# Patient Record
Sex: Male | Born: 2000 | Race: Black or African American | Hispanic: No | Marital: Single | Smoking: Never smoker
Health system: Southern US, Community
[De-identification: ages and names within clinical notes are randomized; demographics above are authoritative.]

## PROBLEM LIST (undated history)

## (undated) DIAGNOSIS — J45909 Unspecified asthma, uncomplicated: Secondary | ICD-10-CM

---
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
## 2020-07-16 ENCOUNTER — Ambulatory Visit (INDEPENDENT_AMBULATORY_CARE_PROVIDER_SITE_OTHER): Payer: Medicaid Other

## 2020-07-16 ENCOUNTER — Other Ambulatory Visit: Payer: Self-pay

## 2020-07-16 ENCOUNTER — Encounter: Payer: Self-pay | Admitting: Emergency Medicine

## 2020-07-16 ENCOUNTER — Ambulatory Visit
Admission: EM | Admit: 2020-07-16 | Discharge: 2020-07-16 | Disposition: A | Discharge status: Home / Self Care | Payer: Medicaid Other | Attending: Family Medicine | Admitting: Family Medicine

## 2020-07-16 DIAGNOSIS — S93602A Unspecified sprain of left foot, initial encounter: Secondary | ICD-10-CM

## 2020-07-16 DIAGNOSIS — S8002XA Contusion of left knee, initial encounter: Secondary | ICD-10-CM

## 2020-07-16 NOTE — ED Provider Notes (Signed)
[facility]    CSN: [id number] Arrival date & time: 07/16/20  1515      History   Chief Complaint Chief Complaint  Patient presents with  . Knee Pain    left  . Ankle Pain    left    HPI Ryan Wolf is a 19 y.o. male.   19 yo male with a c/o left knee and foot pain since falling while playing basketball and injuring it. States he was hit by another player on the knee and then fell to the hardwood floor. Denies any numbness/tingling.    Knee Pain Ankle Pain   Past Medical History:  Diagnosis Date  . Asthma    exercise induced    There are no problems to display for this patient.   History reviewed. No pertinent surgical history.     Home Medications    Prior to Admission medications   Medication Sig Start Date End Date Taking? Authorizing Provider  albuterol (PROVENTIL HFA) 108 (90 Base) MCG/ACT inhaler Inhale 2 puffs into the lungs every 4 (four) hours as needed. 05/16/17  Yes [provider]  ondansetron (ZOFRAN) 4 MG tablet Take 1 tablet (4 mg total) by mouth every 6 (six) hours. 05/16/18   Duanne Limerick, MD    Family History Family History  Problem Relation Age of Onset  . Heart attack Mother   . CAD Mother   . Healthy Father     Social History Social History   Tobacco Use  . Smoking status: Never Smoker  . Smokeless tobacco: Never Used  Vaping Use  . Vaping Use: Never used  Substance Use Topics  . Alcohol use: Never  . Drug use: Never     Allergies   Penicillins   Review of Systems Review of Systems   Physical Exam Triage Vital Signs ED Triage Vitals  Enc Vitals Group     BP 07/16/20 1537 115/73     Pulse Rate 07/16/20 1537 60     Resp 07/16/20 1537 16     Temp 07/16/20 1537 98.4 F (36.9 C)     Temp Source 07/16/20 1537 Oral     SpO2 07/16/20 1537 98 %     Weight 07/16/20 1535 134 lb 3.2 oz (60.9 kg)     Height --      Head Circumference --      Peak Flow --      Pain Score 07/16/20 1534 7       Pain Loc --      Pain Edu? --      Excl. in GC? --    No data found.  Updated Vital Signs BP 115/73 (BP Location: Left Arm)   Pulse 60   Temp 98.4 F (36.9 C) (Oral)   Resp 16   Wt 60.9 kg   SpO2 98%   Visual Acuity Right Eye Distance:   Left Eye Distance:   Bilateral Distance:    Right Eye Near:   Left Eye Near:    Bilateral Near:     Physical Exam Vitals and nursing note reviewed.  Constitutional:      General: He is not in acute distress.    Appearance: He is not toxic-appearing or diaphoretic.  Musculoskeletal:     Left knee: Swelling (mild) and bony tenderness present. No deformity, effusion, erythema, ecchymosis or lacerations. Normal range of motion. Normal pulse.     Left foot: Normal range of motion and normal capillary refill. Bony tenderness present.  No swelling, deformity, bunion, Charcot foot, foot drop, prominent metatarsal heads, laceration or crepitus. Normal pulse.     Comments: Left lower extremity neurovascularly intact  Neurological:     Mental Status: He is alert.      UC Treatments / Results  Labs (all labs ordered are listed, but only abnormal results are displayed) Labs Reviewed - No data to display  EKG   Radiology DG Knee Complete 4 Views Left  Result Date: 07/16/2020 CLINICAL DATA:  Pain after fall EXAM: LEFT KNEE - COMPLETE 4+ VIEW COMPARISON:  None. FINDINGS: No evidence of fracture, dislocation, or joint effusion. No evidence of arthropathy or other focal bone abnormality. Soft tissues are unremarkable. IMPRESSION: Negative. Electronically Signed   By: Jasmine Pang M.D.   On: 07/16/2020 17:14   DG Foot Complete Left  Result Date: 07/16/2020 CLINICAL DATA:  Left foot pain after fall EXAM: LEFT FOOT - COMPLETE 3+ VIEW COMPARISON:  None. FINDINGS: There is no evidence of fracture or dislocation. There is no evidence of arthropathy or other focal bone abnormality. Soft tissues are unremarkable. IMPRESSION: Negative. Electronically  Signed   By: Jasmine Pang M.D.   On: 07/16/2020 17:14    Procedures Procedures (including critical care time)  Medications Ordered in UC Medications - No data to display  Initial Impression / Assessment and Plan / UC Course  I have reviewed the triage vital signs and the nursing notes.  Pertinent labs & imaging results that were available during my care of the patient were reviewed by me and considered in my medical decision making (see chart for details).     Final Clinical Impressions(s) / UC Diagnoses   Final diagnoses:  Contusion of left knee, initial encounter  Foot sprain, left, initial encounter     Discharge Instructions     Rest, ice, elevation, over the counter tylenol/ibuprofen    ED Prescriptions    None      1. x-ray results and diagnosis reviewed with patient 2. Recommend supportive treatment as above 3. Follow-up prn if symptoms worsen or don't improve   PDMP not reviewed this encounter.   Payton Mccallum, MD 07/16/20 1721

## 2020-07-16 NOTE — Discharge Instructions (Signed)
Rest, ice, elevation, over the counter tylenol/ibuprofen

## 2020-07-16 NOTE — ED Triage Notes (Signed)
Patient states that he was playing basketball this morning and another player ran into him and patient fell down.  Patient c/o pain in his left knee, left ankle and left foot.

## 2021-11-04 ENCOUNTER — Other Ambulatory Visit: Payer: Self-pay

## 2021-11-04 ENCOUNTER — Ambulatory Visit
Admission: EM | Admit: 2021-11-04 | Discharge: 2021-11-04 | Disposition: A | Discharge status: Home / Self Care | Payer: Medicaid Other | Attending: Physician Assistant | Admitting: Physician Assistant

## 2021-11-04 ENCOUNTER — Encounter: Payer: Self-pay | Admitting: Emergency Medicine

## 2021-11-04 DIAGNOSIS — J069 Acute upper respiratory infection, unspecified: Secondary | ICD-10-CM | POA: Diagnosis present

## 2021-11-04 DIAGNOSIS — R051 Acute cough: Secondary | ICD-10-CM | POA: Insufficient documentation

## 2021-11-04 DIAGNOSIS — R0981 Nasal congestion: Secondary | ICD-10-CM | POA: Diagnosis present

## 2021-11-04 LAB — RAPID INFLUENZA A&B ANTIGENS
Influenza A (ARMC): NEGATIVE
Influenza B (ARMC): NEGATIVE

## 2021-11-04 MED ORDER — PREDNISONE 10 MG (21) PO TBPK: ORAL_TABLET | Freq: Every day | ORAL | 0 refills | Status: DC

## 2021-11-04 NOTE — Discharge Instructions (Addendum)
Your symptoms are for viral in nature  Take oct meds to help with symptoms such as dayquil  Take tylenol or motrin as needed for fever or pain

## 2021-11-04 NOTE — ED Triage Notes (Signed)
Patient c/o cough, nasal congestion, and runny nose and bodyaches that started yesterday. Patient states that he was exposed to the flu.

## 2021-11-04 NOTE — ED Provider Notes (Signed)
[facility]    CSN: [id number] Arrival date & time: 11/04/21  0834      History   Chief Complaint Chief Complaint  Patient presents with   Cough   Nasal Congestion    HPI Ryan Wolf is a 20 y.o. male.   Pt is here for cough, congestion, body aches. States that he was exposed to someone with the flu. Denies any known fever. Has not taken anthing pta.    Past Medical History:  Diagnosis Date   Asthma    exercise induced    There are no problems to display for this patient.   No past surgical history on file.     Home Medications    Prior to Admission medications   Medication Sig Start Date End Date Taking? Authorizing Provider  predniSONE (STERAPRED UNI-PAK 21 TAB) 10 MG (21) TBPK tablet Take by mouth daily. Take 6 tabs by mouth daily  for 2 days, then 5 tabs for 2 days, then 4 tabs for 2 days, then 3 tabs for 2 days, 2 tabs for 2 days, then 1 tab by mouth daily for 2 days 11/04/21  Yes Coralyn Mark, NP  albuterol (PROVENTIL HFA) 108 (90 Base) MCG/ACT inhaler Inhale 2 puffs into the lungs every 4 (four) hours as needed. 05/16/17   [provider]  ondansetron (ZOFRAN) 4 MG tablet Take 1 tablet (4 mg total) by mouth every 6 (six) hours. 05/16/18   Duanne Limerick, MD    Family History Family History  Problem Relation Age of Onset   Heart attack Mother    CAD Mother    Healthy Father     Social History Social History   Tobacco Use   Smoking status: Never   Smokeless tobacco: Never  Vaping Use   Vaping Use: Every day  Substance Use Topics   Alcohol use: Never   Drug use: Never     Allergies   Penicillins   Review of Systems Review of Systems  Constitutional:  Negative for fever.  HENT:  Positive for congestion, postnasal drip, rhinorrhea, sinus pressure, sinus pain and sneezing. Negative for sore throat.   Respiratory:  Positive for cough. Negative for shortness of breath and wheezing.   Cardiovascular: Negative.    Gastrointestinal: Negative.   Genitourinary: Negative.   Musculoskeletal: Negative.   Neurological: Negative.     Physical Exam Triage Vital Signs ED Triage Vitals  Enc Vitals Group     BP 11/04/21 0905 93/76     Pulse Rate 11/04/21 0905 66     Resp 11/04/21 0905 16     Temp 11/04/21 0905 98.3 F (36.8 C)     Temp Source 11/04/21 0905 Oral     SpO2 11/04/21 0905 99 %     Weight 11/04/21 0902 135 lb (61.2 kg)     Height 11/04/21 0902 5\' 7"  (1.702 m)     Head Circumference --      Peak Flow --      Pain Score 11/04/21 0902 0     Pain Loc --      Pain Edu? --      Excl. in GC? --    No data found.  Updated Vital Signs BP 93/76 (BP Location: Left Arm)   Pulse 66   Temp 98.3 F (36.8 C) (Oral)   Resp 16   Ht 5\' 7"  (1.702 m)   Wt 135 lb (61.2 kg)   SpO2 99%   BMI 21.14 kg/m  Visual Acuity Right Eye Distance:   Left Eye Distance:   Bilateral Distance:    Right Eye Near:   Left Eye Near:    Bilateral Near:     Physical Exam Constitutional:      Appearance: Normal appearance.  HENT:     Right Ear: Tympanic membrane normal.     Left Ear: Tympanic membrane normal.     Nose: Congestion and rhinorrhea present.     Mouth/Throat:     Mouth: Mucous membranes are moist.     Pharynx: Oropharynx is clear. No oropharyngeal exudate or posterior oropharyngeal erythema.  Eyes:     Pupils: Pupils are equal, round, and reactive to light.  Cardiovascular:     Rate and Rhythm: Normal rate.  Pulmonary:     Effort: Pulmonary effort is normal.  Abdominal:     General: Abdomen is flat.  Skin:    General: Skin is warm.  Neurological:     General: No focal deficit present.     Mental Status: He is alert.     UC Treatments / Results  Labs (all labs ordered are listed, but only abnormal results are displayed) Labs Reviewed  RAPID INFLUENZA A&B ANTIGENS    EKG   Radiology No results found.  Procedures Procedures (including critical care time)  Medications  Ordered in UC Medications - No data to display  Initial Impression / Assessment and Plan / UC Course  I have reviewed the triage vital signs and the nursing notes.  Pertinent labs & imaging results that were available during my care of the patient were reviewed by me and considered in my medical decision making (see chart for details).     your symptoms are for viral in nature  Take oct meds to help with symptoms such as dayquil  Take tylenol or motrin as needed for fever or pain  Flu test negative  Final Clinical Impressions(s) / UC Diagnoses   Final diagnoses:  Acute cough  Nasal congestion  Viral URI with cough     Discharge Instructions      Your symptoms are for viral in nature  Take oct meds to help with symptoms such as dayquil  Take tylenol or motrin as needed for fever or pain       ED Prescriptions     Medication Sig Dispense Auth. Provider   predniSONE (STERAPRED UNI-PAK 21 TAB) 10 MG (21) TBPK tablet Take by mouth daily. Take 6 tabs by mouth daily  for 2 days, then 5 tabs for 2 days, then 4 tabs for 2 days, then 3 tabs for 2 days, 2 tabs for 2 days, then 1 tab by mouth daily for 2 days 42 tablet Coralyn Mark, NP      PDMP not reviewed this encounter.   Coralyn Mark, NP 11/04/21 [phone]

## 2021-11-30 ENCOUNTER — Ambulatory Visit
Admission: EM | Admit: 2021-11-30 | Discharge: 2021-11-30 | Disposition: A | Discharge status: Home / Self Care | Payer: Medicaid Other | Attending: Medical Oncology | Admitting: Medical Oncology

## 2021-11-30 ENCOUNTER — Other Ambulatory Visit: Payer: Self-pay

## 2021-11-30 ENCOUNTER — Encounter: Payer: Self-pay | Admitting: Emergency Medicine

## 2021-11-30 DIAGNOSIS — J029 Acute pharyngitis, unspecified: Secondary | ICD-10-CM | POA: Insufficient documentation

## 2021-11-30 LAB — GROUP A STREP BY PCR: Group A Strep by PCR: NOT DETECTED

## 2021-11-30 NOTE — ED Provider Notes (Signed)
[facility]    CSN: [id number] Arrival date & time: 11/30/21  1224      History   Chief Complaint Chief Complaint  Patient presents with   Sore Throat    HPI Ryan Wolf is a 20 y.o. male.   HPI  Sore Throat: Pt states that he has had a sore throat for the last few hours- symptoms started last night.  Symptoms are worse when he tries to eat or drink or talk.  He denies any other symptoms such as fever, vomiting, headache, cough.  He has tried cough drops for symptoms as he cannot think of anything else to try.  Cough drops do not really help his symptoms.  Past Medical History:  Diagnosis Date   Asthma    exercise induced    There are no problems to display for this patient.   History reviewed. No pertinent surgical history.     Home Medications    Prior to Admission medications   Medication Sig Start Date End Date Taking? Authorizing Provider  albuterol (PROVENTIL HFA) 108 (90 Base) MCG/ACT inhaler Inhale 2 puffs into the lungs every 4 (four) hours as needed. 05/16/17   [provider]  ondansetron (ZOFRAN) 4 MG tablet Take 1 tablet (4 mg total) by mouth every 6 (six) hours. 05/16/18   Duanne Limerick, MD  predniSONE (STERAPRED UNI-PAK 21 TAB) 10 MG (21) TBPK tablet Take by mouth daily. Take 6 tabs by mouth daily  for 2 days, then 5 tabs for 2 days, then 4 tabs for 2 days, then 3 tabs for 2 days, 2 tabs for 2 days, then 1 tab by mouth daily for 2 days 11/04/21   Coralyn Mark, NP    Family History Family History  Problem Relation Age of Onset   Heart attack Mother    CAD Mother    Healthy Father     Social History Social History   Tobacco Use   Smoking status: Never   Smokeless tobacco: Never  Vaping Use   Vaping Use: Every day  Substance Use Topics   Alcohol use: Never   Drug use: Never     Allergies   Penicillins   Review of Systems Review of Systems  As stated above in HPI Physical Exam Triage Vital  Signs ED Triage Vitals  Enc Vitals Group     BP 11/30/21 1434 (!) 142/80     Pulse Rate 11/30/21 1434 62     Resp 11/30/21 1434 16     Temp 11/30/21 1434 98.6 F (37 C)     Temp Source 11/30/21 1434 Oral     SpO2 11/30/21 1434 100 %     Weight --      Height --      Head Circumference --      Peak Flow --      Pain Score 11/30/21 1435 6     Pain Loc --      Pain Edu? --      Excl. in GC? --    No data found.  Updated Vital Signs BP (!) 142/80 (BP Location: Left Arm)   Pulse 62   Temp 98.6 F (37 C) (Oral)   Resp 16   SpO2 100%   Physical Exam Vitals and nursing note reviewed.  Constitutional:      General: He is not in acute distress.    Appearance: He is well-developed. He is not ill-appearing, toxic-appearing or diaphoretic.  HENT:  Head: Normocephalic and atraumatic.     Right Ear: Tympanic membrane normal.     Left Ear: Tympanic membrane normal.     Nose: No congestion or rhinorrhea.     Mouth/Throat:     Mouth: Mucous membranes are moist.     Pharynx: Oropharynx is clear. Uvula midline. Posterior oropharyngeal erythema present. No pharyngeal swelling, oropharyngeal exudate or uvula swelling.     Tonsils: No tonsillar exudate or tonsillar abscesses.  Eyes:     Conjunctiva/sclera: Conjunctivae normal.     Pupils: Pupils are equal, round, and reactive to light.  Cardiovascular:     Rate and Rhythm: Normal rate and regular rhythm.     Heart sounds: Normal heart sounds.  Pulmonary:     Effort: Pulmonary effort is normal.     Breath sounds: Normal breath sounds.  Musculoskeletal:     Cervical back: Neck supple.  Lymphadenopathy:     Cervical: Cervical adenopathy present.  Skin:    General: Skin is warm.  Neurological:     Mental Status: He is alert and oriented to person, place, and time.     UC Treatments / Results  Labs (all labs ordered are listed, but only abnormal results are displayed) Labs Reviewed  GROUP A STREP BY PCR     EKG   Radiology No results found.  Procedures Procedures (including critical care time)  Medications Ordered in UC Medications - No data to display  Initial Impression / Assessment and Plan / UC Course  I have reviewed the triage vital signs and the nursing notes.  Pertinent labs & imaging results that were available during my care of the patient were reviewed by me and considered in my medical decision making (see chart for details).     New. Strep PCR pending. Will treat appropriately. If not positive for strep this is likely viral in nature. Rest and hydration with water. Tylenol or Ibuprofen PRN. Follow up PRN.    Final Clinical Impressions(s) / UC Diagnoses   Final diagnoses:  Pharyngitis, unspecified etiology   Discharge Instructions   None    ED Prescriptions   None    PDMP not reviewed this encounter.   Rushie Chestnut, [place] 11/30/21 1600

## 2021-11-30 NOTE — ED Triage Notes (Signed)
Pt c/o ST that started last night  

## 2022-02-20 ENCOUNTER — Other Ambulatory Visit: Payer: Self-pay

## 2022-02-20 ENCOUNTER — Ambulatory Visit
Admission: EM | Admit: 2022-02-20 | Discharge: 2022-02-20 | Disposition: A | Discharge status: Home / Self Care | Payer: Medicaid Other | Attending: Physician Assistant | Admitting: Physician Assistant

## 2022-02-20 DIAGNOSIS — J45901 Unspecified asthma with (acute) exacerbation: Secondary | ICD-10-CM | POA: Insufficient documentation

## 2022-02-20 DIAGNOSIS — Z20822 Contact with and (suspected) exposure to covid-19: Secondary | ICD-10-CM | POA: Insufficient documentation

## 2022-02-20 DIAGNOSIS — R051 Acute cough: Secondary | ICD-10-CM | POA: Diagnosis not present

## 2022-02-20 DIAGNOSIS — B349 Viral infection, unspecified: Secondary | ICD-10-CM | POA: Diagnosis present

## 2022-02-20 DIAGNOSIS — J029 Acute pharyngitis, unspecified: Secondary | ICD-10-CM | POA: Insufficient documentation

## 2022-02-20 LAB — RESP PANEL BY RT-PCR (FLU A&B, COVID) ARPGX2
Influenza A by PCR: NEGATIVE
Influenza B by PCR: NEGATIVE
SARS Coronavirus 2 by RT PCR: NEGATIVE

## 2022-02-20 LAB — GROUP A STREP BY PCR: Group A Strep by PCR: NOT DETECTED

## 2022-02-20 MED ORDER — PREDNISONE 20 MG PO TABS: 40.0000 mg | ORAL_TABLET | Freq: Every day | ORAL | 0 refills | Status: AC

## 2022-02-20 MED ORDER — LIDOCAINE VISCOUS HCL 2 % MT SOLN: 15.0000 mL | OROMUCOSAL | 0 refills | Status: DC | PRN

## 2022-02-20 NOTE — Discharge Instructions (Signed)
-  Your strep test is negative. - We have obtained a COVID and flu test.  Someone will call you with the results when we get it today.  If you are positive for COVID you to be isolated 5 days from symptom onset and wear mask for 5 days. - Symptoms are consistent with a virus.  I also suspect her asthma has been flared up since you have a lot of wheezing.  I have sent prednisone to the pharmacy.  You should also use your inhaler at home.  Additionally I have sent a cough medication for you to try and viscous lidocaine to help with your sore throat. - You should be feeling better within the next week or so. - If you are breathing were to worsen you should be seen again.

## 2022-02-20 NOTE — ED Provider Notes (Signed)
[facility]    CSN: [id number] Arrival date & time: 02/20/22  1337      History   Chief Complaint Chief Complaint  Patient presents with   Sore Throat    HPI Nicohlas Stjulien is a 21 y.o. male presenting for 2-day history of sore throat, cough and chest pain on coughing.  Also reports wheezing and chest tightness.  Nasal congestion as well.  No fevers but he has been sweaty.  No sick contacts or known exposure to COVID-19.  Patient has taken over-the-counter cough medication without improvement in symptoms.  He has a history of asthma.  Has not used his inhaler at home.  No other concerns.  HPI  Past Medical History:  Diagnosis Date   Asthma    exercise induced    There are no problems to display for this patient.   History reviewed. No pertinent surgical history.     Home Medications    Prior to Admission medications   Medication Sig Start Date End Date Taking? Authorizing Provider  albuterol (PROVENTIL HFA) 108 (90 Base) MCG/ACT inhaler Inhale 2 puffs into the lungs every 4 (four) hours as needed. 05/16/17   [provider]  ondansetron (ZOFRAN) 4 MG tablet Take 1 tablet (4 mg total) by mouth every 6 (six) hours. 05/16/18   Duanne Limerick, MD  predniSONE (STERAPRED UNI-PAK 21 TAB) 10 MG (21) TBPK tablet Take by mouth daily. Take 6 tabs by mouth daily  for 2 days, then 5 tabs for 2 days, then 4 tabs for 2 days, then 3 tabs for 2 days, 2 tabs for 2 days, then 1 tab by mouth daily for 2 days 11/04/21   Coralyn Mark, NP    Family History Family History  Problem Relation Age of Onset   Heart attack Mother    CAD Mother    Healthy Father     Social History Social History   Tobacco Use   Smoking status: Never   Smokeless tobacco: Never  Vaping Use   Vaping Use: Every day  Substance Use Topics   Alcohol use: Yes   Drug use: Never     Allergies   Penicillins   Review of Systems Review of Systems  Constitutional:  Positive for  diaphoresis. Negative for fatigue and fever.  HENT:  Positive for congestion and sore throat. Negative for rhinorrhea, sinus pressure and sinus pain.   Respiratory:  Positive for cough, chest tightness and wheezing. Negative for shortness of breath.   Cardiovascular:  Positive for chest pain.  Gastrointestinal:  Negative for abdominal pain, diarrhea, nausea and vomiting.  Musculoskeletal:  Negative for myalgias.  Neurological:  Negative for weakness, light-headedness and headaches.  Hematological:  Negative for adenopathy.    Physical Exam Triage Vital Signs ED Triage Vitals  Enc Vitals Group     BP 02/20/22 1509 (!) 132/93     Pulse Rate 02/20/22 1509 75     Resp 02/20/22 1509 18     Temp 02/20/22 1509 98.5 F (36.9 C)     Temp Source 02/20/22 1509 Oral     SpO2 02/20/22 1509 99 %     Weight 02/20/22 1508 130 lb (59 kg)     Height 02/20/22 1508 5\' 7"  (1.702 m)     Head Circumference --      Peak Flow --      Pain Score 02/20/22 1507 7     Pain Loc --      Pain Edu? --  Excl. in GC? --    No data found.  Updated Vital Signs BP (!) 132/93 (BP Location: Left Arm)    Pulse 75    Temp 98.5 F (36.9 C) (Oral)    Resp 18    Ht 5\' 7"  (1.702 m)    Wt 130 lb (59 kg)    SpO2 99%    BMI 20.36 kg/m      Physical Exam Vitals and nursing note reviewed.  Constitutional:      General: He is not in acute distress.    Appearance: Normal appearance. He is well-developed. He is ill-appearing.  HENT:     Head: Normocephalic and atraumatic.     Nose: Nose normal.     Mouth/Throat:     Mouth: Mucous membranes are moist.     Pharynx: Oropharynx is clear. Posterior oropharyngeal erythema present.  Eyes:     Conjunctiva/sclera: Conjunctivae normal.  Cardiovascular:     Rate and Rhythm: Normal rate and regular rhythm.     Heart sounds: Normal heart sounds. No murmur heard. Pulmonary:     Effort: Pulmonary effort is normal. No respiratory distress.     Breath sounds: Wheezing (diffuse  wheezing throughout all lung fields) present.  Musculoskeletal:     Cervical back: Neck supple.  Skin:    General: Skin is warm and dry.     Capillary Refill: Capillary refill takes less than 2 seconds.  Neurological:     General: No focal deficit present.     Mental Status: He is alert. Mental status is at baseline.     Motor: No weakness.     Coordination: Coordination normal.     Gait: Gait normal.  Psychiatric:        Mood and Affect: Mood normal.        Behavior: Behavior normal.        Thought Content: Thought content normal.     UC Treatments / Results  Labs (all labs ordered are listed, but only abnormal results are displayed) Labs Reviewed  GROUP A STREP BY PCR  RESP PANEL BY RT-PCR (FLU A&B, COVID) [id number]    EKG   Radiology No results found.  Procedures Procedures (including critical care time)  Medications Ordered in UC Medications - No data to display  Initial Impression / Assessment and Plan / UC Course  I have reviewed the triage vital signs and the nursing notes.  Pertinent labs & imaging results that were available during my care of the patient were reviewed by me and considered in my medical decision making (see chart for details).  21 year old male presenting with 2-day history of cough, sore throat and chest tightness/pain.  Vitals are stable.  Patient has mildly ill-appearing but nontoxic.  On exam does have erythema posterior pharynx.  Mild nasal congestion.  Diffuse wheezing throughout chest.  Strep test negative.  Respiratory panel obtained.  Advised we will contact him with results.  Negative respiratory panel.  Patient advised symptoms consistent with a viral illness and likely asthma exacerbation.  Supportive care reviewed.  Advised to increase rest and fluids.  Sent prednisone and viscous lidocaine.  Advised to use at home inhaler.  Reviewed return and ER precautions.   Final Clinical Impressions(s) / UC Diagnoses   Final diagnoses:   Viral illness  Asthma with acute exacerbation, unspecified asthma severity, unspecified whether persistent  Sore throat  Acute cough     Discharge Instructions      -Your strep test is negative. -  We have obtained a COVID and flu test.  Someone will call you with the results when we get it today.  If you are positive for COVID you to be isolated 5 days from symptom onset and wear mask for 5 days. - Symptoms are consistent with a virus.  I also suspect her asthma has been flared up since you have a lot of wheezing.  I have sent prednisone to the pharmacy.  You should also use your inhaler at home.  Additionally I have sent a cough medication for you to try and viscous lidocaine to help with your sore throat. - You should be feeling better within the next week or so. - If you are breathing were to worsen you should be seen again.     ED Prescriptions   None    PDMP not reviewed this encounter.   Shirlee Latch, PA-C 02/20/22 1658

## 2022-02-20 NOTE — ED Triage Notes (Signed)
Pt c/o throat and chest "problems" x4 days. Pt states that he has had a cough and night sweats.   Pt wants to be tested for strep throat.   Pt states that if he breathes too deeply his chest feels like its "closing" and he has chest pain.   Pt states that his throat hurts when swallowing.

## 2022-04-06 ENCOUNTER — Ambulatory Visit
Admission: RE | Admit: 2022-04-06 | Discharge: 2022-04-06 | Disposition: A | Discharge status: Home / Self Care | Payer: Medicaid Other | Source: Ambulatory Visit | Attending: Emergency Medicine | Admitting: Emergency Medicine

## 2022-04-06 ENCOUNTER — Ambulatory Visit (INDEPENDENT_AMBULATORY_CARE_PROVIDER_SITE_OTHER): Payer: Medicaid Other

## 2022-04-06 ENCOUNTER — Other Ambulatory Visit: Payer: Self-pay

## 2022-04-06 VITALS — BP 143/79 | HR 86 | Temp 99.4°F | Resp 18

## 2022-04-06 DIAGNOSIS — J029 Acute pharyngitis, unspecified: Secondary | ICD-10-CM

## 2022-04-06 DIAGNOSIS — M542 Cervicalgia: Secondary | ICD-10-CM

## 2022-04-06 DIAGNOSIS — J020 Streptococcal pharyngitis: Secondary | ICD-10-CM | POA: Diagnosis not present

## 2022-04-06 LAB — CBC WITH DIFFERENTIAL/PLATELET
Abs Immature Granulocytes: 0.02 10*3/uL (ref 0.00–0.07)
Basophils Absolute: 0.1 10*3/uL (ref 0.0–0.1)
Basophils Relative: 1 %
Eosinophils Absolute: 0.4 10*3/uL (ref 0.0–0.5)
Eosinophils Relative: 4 %
HCT: 40.9 % (ref 39.0–52.0)
Hemoglobin: 14 g/dL (ref 13.0–17.0)
Immature Granulocytes: 0 %
Lymphocytes Relative: 20 %
Lymphs Abs: 2 10*3/uL (ref 0.7–4.0)
MCH: 31.3 pg (ref 26.0–34.0)
MCHC: 34.2 g/dL (ref 30.0–36.0)
MCV: 91.5 fL (ref 80.0–100.0)
Monocytes Absolute: 0.6 10*3/uL (ref 0.1–1.0)
Monocytes Relative: 6 %
Neutro Abs: 6.9 10*3/uL (ref 1.7–7.7)
Neutrophils Relative %: 69 %
Platelets: 453 10*3/uL — ABNORMAL HIGH (ref 150–400)
RBC: 4.47 MIL/uL (ref 4.22–5.81)
RDW: 12.3 % (ref 11.5–15.5)
WBC: 9.9 10*3/uL (ref 4.0–10.5)
nRBC: 0 % (ref 0.0–0.2)

## 2022-04-06 LAB — MONONUCLEOSIS SCREEN: Mono Screen: NEGATIVE

## 2022-04-06 LAB — GROUP A STREP BY PCR: Group A Strep by PCR: DETECTED — AB

## 2022-04-06 MED ORDER — IBUPROFEN 600 MG PO TABS: 600.0000 mg | ORAL_TABLET | Freq: Four times a day (QID) | ORAL | 0 refills | Status: DC | PRN

## 2022-04-06 MED ORDER — AZITHROMYCIN 500 MG PO TABS: 500.0000 mg | ORAL_TABLET | Freq: Every day | ORAL | 0 refills | Status: AC

## 2022-04-06 MED ORDER — ACETAMINOPHEN 500 MG PO TABS
1000.0000 mg | ORAL_TABLET | Freq: Once | ORAL | Status: AC
  Administered 2022-04-06: 1000 mg via ORAL

## 2022-04-06 MED ORDER — IBUPROFEN 800 MG PO TABS
800.0000 mg | ORAL_TABLET | Freq: Once | ORAL | Status: AC
  Administered 2022-04-06: 800 mg via ORAL

## 2022-04-06 NOTE — ED Triage Notes (Addendum)
Pt is here with a swollen neck pain/sore throat that started 2 days,pt has not taken any meds to relieve discomfort. ?

## 2022-04-06 NOTE — Discharge Instructions (Addendum)
Your strep PCR test was positive today.  Your mono is negative and your CBC is normal.  1 gram of Tylenol and 600 mg ibuprofen together 3-4 times a day as needed for pain.  Make sure you drink plenty of extra fluids.  Some people find salt water gargles and  Traditional Medic[employer]'s "Throat Coat" tea helpful. Take 5 mL of liquid Benadryl and 5 mL of Maalox. Mix it together, and then hold it in your mouth for as long as you can and then swallow. You may do this 4 times a day.   ? ?Go to www.goodrx.com  or www.costplusdrugs.com to look up your medications. This will give you a list of where you can find your prescriptions at the most affordable prices. Or ask the pharmacist what the cash price is, or if they have any other discount programs available to help make your medication more affordable. This can be less expensive than what you would pay with insurance.   ?

## 2022-04-06 NOTE — ED Provider Notes (Signed)
?HPI ? ?SUBJECTIVE: ? ?Patient reports sore throat starting 2 days ago.  He reports bilateral neck swelling worse on the left.  He reports left ear pain coming from his neck, intermittent headaches, some rhinorrhea, occasional cough.  No fevers, body aches, and postnasal drip, shortness of breath, nausea, vomiting, diarrhea, abdominal pain, rash.  No voice changes, drooling, trismus.  He states that he felt as if his throat was swelling yesterday, but denies difficulty breathing or swallowing while it felt swollen.  He reports pain with neck extension and with lateral rotation to the right more than the left.  No allergy or GERD symptoms.  No COVID, flu, strep, mono exposure.  He got 3 doses of the COVID-vaccine.  He is not sure about the flu vaccine.  No acute dental pain, facial swelling, swelling underneath the jaw.  No trauma to the neck.  No antibiotics in the past month.  No antipyretic in the past 6 hours.  He tried sleeping without improvement in his symptoms.  Symptoms are worse with neck extension and rotation.  He has no past medical history.  PCP: [facility] in [place]. ? ?Past Medical History:  ?Diagnosis Date  ? Asthma   ? exercise induced  ? ? ?History reviewed. No pertinent surgical history. ? ?Family History  ?Problem Relation Age of Onset  ? Heart attack Mother   ? CAD Mother   ? Healthy Father   ? ? ?Social History  ? ?Tobacco Use  ? Smoking status: Never  ? Smokeless tobacco: Never  ?Vaping Use  ? Vaping Use: Every day  ?Substance Use Topics  ? Alcohol use: Yes  ? Drug use: Yes  ?  Types: Marijuana  ? ? ?No current facility-administered medications for this encounter. ? ?Current Outpatient Medications:  ?  albuterol (PROVENTIL HFA) 108 (90 Base) MCG/ACT inhaler, Inhale 2 puffs into the lungs every 4 (four) hours as needed., Disp: , Rfl:  ?  lidocaine (XYLOCAINE) 2 % solution, Use as directed 15 mLs in the mouth or throat every 3 (three) hours as needed for mouth pain (swish and spit).,  Disp: 100 mL, Rfl: 0 ?  ondansetron (ZOFRAN) 4 MG tablet, Take 1 tablet (4 mg total) by mouth every 6 (six) hours., Disp: 12 tablet, Rfl: 0 ? ?Allergies  ?Allergen Reactions  ? Penicillins Swelling  ?  Other reaction(s): RASH ?  ? ? ? ?ROS ? ?As noted in HPI.  ? ?Physical Exam ? ?BP (!) 143/79 (BP Location: Right Arm)   Pulse 86   Temp 99.4 ?F (37.4 ?C)   Resp 18   SpO2 100%  ? ?Constitutional: Well developed, well nourished, no acute distress.  No drooling, trismus, stridor ?Eyes:  EOMI, conjunctiva normal bilaterally ?HENT: Normocephalic, atraumatic,mucus membranes moist.  TMs normal bilaterally.  Mild nasal congestion.  Erythematous oropharynx, tonsils normal size without exudate.  Uvula midline.  Positive cobblestoning.  No obvious postnasal drip.  Normal dentition, nontender.  No swelling underneath the jaw or facial swelling.  Thyroid nontender, normal size.  No neck stiffness. ?Respiratory: Normal inspiratory effort ?Cardiovascular: Normal rate, no murmurs, rubs, gallops ?GI: nondistended, nontender. No appreciable splenomegaly ?skin: No rash, skin intact ?Lymph: Positive anterior and posterior cervical lymphadenopathy bilaterally.  Positive tender mass with visible neck swelling on the left.   ?Musculoskeletal: Patient able to flex/extend his neck.  Pain with extension and with lateral rotation to the left and right ?Neurologic: Alert & oriented x 3, no focal neuro deficits ?Psychiatric: Speech and  behavior appropriate. ? ?ED Course ? ? ?Medications  ?acetaminophen (TYLENOL) tablet 1,000 mg (1,000 mg Oral Given 04/06/22 1743)  ?ibuprofen (ADVIL) tablet 800 mg (800 mg Oral Given 04/06/22 1743)  ? ? ?Orders Placed This Encounter  ?Procedures  ? Group A Strep by PCR  ?  Standing Status:   Standing  ?  Number of Occurrences:   1  ?  Order Specific Question:   Patient immune status  ?  Answer:   Normal  ?  Order Specific Question:   Release to patient  ?  Answer:   Immediate  ? DG Neck Soft Tissue  ?  Standing  Status:   Standing  ?  Number of Occurrences:   1  ?  Order Specific Question:   Reason for Exam (SYMPTOM  OR DIAGNOSIS REQUIRED)  ?  Answer:   Sore throat, left-sided neck swelling  ? Mononucleosis screen  ?  Standing Status:   Standing  ?  Number of Occurrences:   1  ? CBC with Differential  ?  Standing Status:   Standing  ?  Number of Occurrences:   1  ? ? ?Results for orders placed or performed during the hospital encounter of 04/06/22 (from the past 24 hour(s))  ?Group A Strep by PCR     Status: Abnormal  ? Collection Time: 04/06/22  5:14 PM  ? Specimen: Throat; Sterile Swab  ?Result Value Ref Range  ? Group A Strep by PCR DETECTED (A) NOT DETECTED  ?CBC with Differential     Status: Abnormal  ? Collection Time: 04/06/22  5:52 PM  ?Result Value Ref Range  ? WBC 9.9 4.0 - 10.5 K/uL  ? RBC 4.47 4.22 - 5.81 MIL/uL  ? Hemoglobin 14.0 13.0 - 17.0 g/dL  ? HCT 40.9 39.0 - 52.0 %  ? MCV 91.5 80.0 - 100.0 fL  ? MCH 31.3 26.0 - 34.0 pg  ? MCHC 34.2 30.0 - 36.0 g/dL  ? RDW 12.3 11.5 - 15.5 %  ? Platelets 453 (H) 150 - 400 K/uL  ? nRBC 0.0 0.0 - 0.2 %  ? Neutrophils Relative % 69 %  ? Neutro Abs 6.9 1.7 - 7.7 K/uL  ? Lymphocytes Relative 20 %  ? Lymphs Abs 2.0 0.7 - 4.0 K/uL  ? Monocytes Relative 6 %  ? Monocytes Absolute 0.6 0.1 - 1.0 K/uL  ? Eosinophils Relative 4 %  ? Eosinophils Absolute 0.4 0.0 - 0.5 K/uL  ? Basophils Relative 1 %  ? Basophils Absolute 0.1 0.0 - 0.1 K/uL  ? Immature Granulocytes 0 %  ? Abs Immature Granulocytes 0.02 0.00 - 0.07 K/uL  ? ?DG Neck Soft Tissue ? ?Result Date: 04/06/2022 ?CLINICAL DATA:  Swollen neck sore throat EXAM: NECK SOFT TISSUES - 1+ VIEW COMPARISON:  None. FINDINGS: Prevertebral soft tissue thickness within normal limits. No retropharyngeal gas collections. Epiglottis within normal limits. Subglottic trachea is patent. No radiopaque foreign body IMPRESSION: Negative. Electronically Signed   By: Donavan Foil M.D.   On: 04/06/2022 17:49   ? ?ED Clinical Impression ? ?1. Strep  pharyngitis   ? ? ? ?ED Assessment/Plan ? ?Patient with a sore throat and visible neck swelling.  He has no facial swelling or swelling underneath the jaw that would be consistent with Ludewig's angina, blocked salivary gland.  Considered septic jugular venous thrombosis,, but think that this is less likely at this time.  checking strep, mono because of anterior and posterior cervical lymphadenopathy.  While he has  no drooling, trismus, muffled, hoarse voice, will get a lateral soft tissue neck because of the visible left-sided neck swelling.  Giving Tylenol and ibuprofen here. ? ?Imaging independently reviewed.  No evidence of epiglottitis, RPA.  See radiology report for details ? ?strep positive. Sending home with azithromycin for 5 days.  He reports "swelling up like a balloon" with penicillin.  He has never tried cephalosporins.  Home with ibuprofen, Tylenol, Benadryl/Maalox mixture. Patient to followup with PMD when necessary, work note for tomorrow. ? ?CBC normal.  Mono negative ? ? ?Discussed labs,  MDM, plan and followup with patient. Discussed sn/sx that should prompt return to the ED. patient agrees with plan.  ? ?Meds ordered this encounter  ?Medications  ? acetaminophen (TYLENOL) tablet 1,000 mg  ? ibuprofen (ADVIL) tablet 800 mg  ? ? ? ?*This clinic note was created using [employer]. Therefore, there may be occasional mistakes despite careful proofreading. ?  ?  ?Melynda Ripple, MD ?04/06/22 1823 ? ?

## 2022-11-27 ENCOUNTER — Ambulatory Visit
Admission: EM | Admit: 2022-11-27 | Discharge: 2022-11-27 | Disposition: A | Discharge status: Home / Self Care | Payer: Medicaid Other | Attending: Internal Medicine | Admitting: Internal Medicine

## 2022-11-27 DIAGNOSIS — J029 Acute pharyngitis, unspecified: Secondary | ICD-10-CM | POA: Diagnosis present

## 2022-11-27 LAB — GROUP A STREP BY PCR: Group A Strep by PCR: NOT DETECTED

## 2022-11-27 MED ORDER — FLUTICASONE PROPIONATE 50 MCG/ACT NA SUSP: 2.0000 | Freq: Every day | NASAL | 0 refills | Status: AC

## 2022-11-27 NOTE — ED Provider Notes (Signed)
[facility]    CSN: [id number] Arrival date & time: 11/27/22  1922      History   Chief Complaint No chief complaint on file.   HPI Ryan Wolf is a 21 y.o. male for evaluation of less than 24-hour throat pain.  The patient states that beginning last evening he began to experience a sore throat and feels as if he had some swelling in his throat but he denies any issues with swallowing food or drink, he denies any shortness of breath.  The patient was treated for strep throat earlier this year and also had to undergo a incision and drainage involving a abscess of a lymph node.  The patient denies any fevers or chills.  He denies any vision changes or headache.  He denies any hearing changes.  Denies any runny nose at today's appointment.  He denies any cough or chest pain or shortness of breath.  No nausea or vomiting.  No diarrhea.  He has not taken any medication for his throat at this time.  He is not taking any routine allergy medication at this time.  HPI  Past Medical History:  Diagnosis Date   Asthma    exercise induced    There are no problems to display for this patient.   History reviewed. No pertinent surgical history.     Home Medications    Prior to Admission medications   Medication Sig Start Date End Date Taking? Authorizing Provider  fluticasone (FLONASE) 50 MCG/ACT nasal spray Place 2 sprays into both nostrils daily. 11/27/22  Yes Anson Oregon, PA-C  albuterol (PROVENTIL HFA) 108 (90 Base) MCG/ACT inhaler Inhale 2 puffs into the lungs every 4 (four) hours as needed. 05/16/17   [provider]  ibuprofen (ADVIL) 600 MG tablet Take 1 tablet (600 mg total) by mouth every 6 (six) hours as needed. 04/06/22   Domenick Gong, MD  lidocaine (XYLOCAINE) 2 % solution Use as directed 15 mLs in the mouth or throat every 3 (three) hours as needed for mouth pain (swish and spit). 02/20/22   Eusebio Friendly B, PA-C  ondansetron (ZOFRAN) 4 MG  tablet Take 1 tablet (4 mg total) by mouth every 6 (six) hours. 05/16/18   Duanne Limerick, MD    Family History Family History  Problem Relation Age of Onset   Heart attack Mother    CAD Mother    Healthy Father     Social History Social History   Tobacco Use   Smoking status: Never   Smokeless tobacco: Never  Vaping Use   Vaping Use: Every day  Substance Use Topics   Alcohol use: Yes   Drug use: Yes    Types: Marijuana     Allergies   Penicillins   Review of Systems Review of Systems  HENT:  Positive for sore throat.   All other systems reviewed and are negative.  Physical Exam Triage Vital Signs ED Triage Vitals  Enc Vitals Group     BP 11/27/22 2004 127/81     Pulse Rate 11/27/22 2004 60     Resp 11/27/22 2004 20     Temp 11/27/22 2004 98.2 F (36.8 C)     Temp Source 11/27/22 2004 Oral     SpO2 11/27/22 2004 99 %     Weight --      Height --      Head Circumference --      Peak Flow --      Pain  Score 11/27/22 2014 0     Pain Loc --      Pain Edu? --      Excl. in GC? --    No data found.  Updated Vital Signs BP 127/81 (BP Location: Right Arm)   Pulse 60   Temp 98.2 F (36.8 C) (Oral)   Resp 20   SpO2 99%   Visual Acuity Right Eye Distance:   Left Eye Distance:   Bilateral Distance:    Right Eye Near:   Left Eye Near:    Bilateral Near:     Physical Exam Constitutional:      General: He is not in acute distress.    Appearance: Normal appearance. He is not diaphoretic.  HENT:     Right Ear: Tympanic membrane and ear canal normal.     Left Ear: Tympanic membrane and ear canal normal.     Nose: Rhinorrhea present.     Comments: Moderate erythema to bilateral nasal canals.    Mouth/Throat:     Pharynx: Posterior oropharyngeal erythema present. No oropharyngeal exudate.     Comments: Mild increase in swelling of tonsils identified. Eyes:     Conjunctiva/sclera: Conjunctivae normal.  Cardiovascular:     Rate and Rhythm: Normal  rate and regular rhythm.     Heart sounds: No murmur heard.    No friction rub. No gallop.  Pulmonary:     Effort: Pulmonary effort is normal.     Breath sounds: Normal breath sounds. No wheezing, rhonchi or rales.  Abdominal:     General: Abdomen is flat. Bowel sounds are normal.     Palpations: Abdomen is soft.  Musculoskeletal:     Cervical back: Normal range of motion. No tenderness.  Lymphadenopathy:     Cervical: No cervical adenopathy.  Neurological:     Mental Status: He is alert.      UC Treatments / Results  Labs (all labs ordered are listed, but only abnormal results are displayed) Labs Reviewed  GROUP A STREP BY PCR    EKG   Radiology No results found.  Procedures Procedures (including critical care time)  Medications Ordered in UC Medications - No data to display  Initial Impression / Assessment and Plan / UC Course  I have reviewed the triage vital signs and the nursing notes.  Pertinent labs & imaging results that were available during my care of the patient were reviewed by me and considered in my medical decision making (see chart for details).     1.  Treatment options were discussed today with the patient. 2.  I believe that his symptoms are likely allergic at this time.  He was instructed to begin taking Zyrtec routinely, Flonase has been prescribed for the patient as well. 3.  Strep test is pending, if positive will begin patient on antibiotics. 4.  Follow-up with urgent care if symptoms fail to improve. Final Clinical Impressions(s) / UC Diagnoses   Final diagnoses:  Acute pharyngitis, unspecified etiology     Discharge Instructions      -I believe your symptoms are likely due to allergies or viral at this time.  Would recommend continuing Zyrtec daily at this time. -I have sent in a prescription of a nasal spray to begin using routinely at this time. -Follow-up if symptoms fail to improve.  Contact urgent care tomorrow for results of  your recent strep test.    ED Prescriptions     Medication Sig Dispense Auth. Provider   fluticasone (  FLONASE) 50 MCG/ACT nasal spray Place 2 sprays into both nostrils daily. 16 g Anson Oregon, PA-C      PDMP not reviewed this encounter.   Anson Oregon, [place] 11/27/22 2104

## 2022-11-27 NOTE — Discharge Instructions (Addendum)
-  I believe your symptoms are likely due to allergies or viral at this time.  Would recommend continuing Zyrtec daily at this time. -I have sent in a prescription of a nasal spray to begin using routinely at this time. -Follow-up if symptoms fail to improve.  Contact urgent care tomorrow for results of your recent strep test.

## 2022-11-27 NOTE — ED Triage Notes (Incomplete)
Pt reports his throat is swollen and hard to swallow since yesterday.

## 2023-02-22 ENCOUNTER — Encounter: Payer: Self-pay | Admitting: Emergency Medicine

## 2023-02-22 ENCOUNTER — Ambulatory Visit
Admission: EM | Admit: 2023-02-22 | Discharge: 2023-02-22 | Disposition: A | Discharge status: Home / Self Care | Payer: Medicaid Other | Attending: Emergency Medicine | Admitting: Emergency Medicine

## 2023-02-22 DIAGNOSIS — J40 Bronchitis, not specified as acute or chronic: Secondary | ICD-10-CM | POA: Diagnosis not present

## 2023-02-22 DIAGNOSIS — J069 Acute upper respiratory infection, unspecified: Secondary | ICD-10-CM | POA: Diagnosis not present

## 2023-02-22 MED ORDER — PROMETHAZINE-DM 6.25-15 MG/5ML PO SYRP: 5.0000 mL | ORAL_SOLUTION | Freq: Four times a day (QID) | ORAL | 0 refills | Status: AC | PRN

## 2023-02-22 MED ORDER — DOXYCYCLINE HYCLATE 100 MG PO CAPS: 100.0000 mg | ORAL_CAPSULE | Freq: Two times a day (BID) | ORAL | 0 refills | Status: AC

## 2023-02-22 MED ORDER — PREDNISONE 20 MG PO TABS: 60.0000 mg | ORAL_TABLET | Freq: Every day | ORAL | 0 refills | Status: AC

## 2023-02-22 MED ORDER — IPRATROPIUM BROMIDE 0.06 % NA SOLN: 2.0000 | Freq: Four times a day (QID) | NASAL | 12 refills | Status: AC

## 2023-02-22 MED ORDER — BENZONATATE 100 MG PO CAPS: 200.0000 mg | ORAL_CAPSULE | Freq: Three times a day (TID) | ORAL | 0 refills | Status: AC

## 2023-02-22 NOTE — Discharge Instructions (Signed)
Use the albuterol inhaler/nebulizer every 4-6 hours as needed for shortness of breath, wheezing, and cough.  Use the Atrovent nasal spray, 2 squirts in each nostril every 6 hours as needed for congestion.  Take the Doxycycline twice daily with food for 7 days for treatment of bronchitis.  Take the prednisone according to the package instructions to help with pulmonary inflammation.  Use the Tessalon Perles every 8 hours for your cough.  Taken with a small sip of water.  They may give you some numbness to the base of your tongue or metallic taste in her mouth, this is normal.  They are designed to calm down the cough reflex.  Use the Promethazine DM cough syrup at bedtime as will make you drowsy.  You may take 1 teaspoon (5 mL) every 6 hours.  Return for reevaluation for new or worsening symptoms.

## 2023-02-22 NOTE — ED Provider Notes (Signed)
[facility]    CSN: [id number] Arrival date & time: 02/22/23  0804      History   Chief Complaint Chief Complaint  Patient presents with   Sore Throat   Cough    HPI Ryan Wolf is a 22 y.o. male.   HPI  22 year old male here for evaluation of sore throat and cough.  The patient has a past medical history significant for asthma and he is a smoker.  He is presenting for 1 week history of runny nose, nasal congestion, sore throat, cough, and intermittent headache.  He denies fever, ear pain, shortness of breath or wheezing, GI complaints, or bodyaches.  He states he was exposed to someone with similar symptoms and though their strep test was negative they are being treated for tonsillitis.  Past Medical History:  Diagnosis Date   Asthma    exercise induced    There are no problems to display for this patient.   History reviewed. No pertinent surgical history.     Home Medications    Prior to Admission medications   Medication Sig Start Date End Date Taking? Authorizing Provider  benzonatate (TESSALON) 100 MG capsule Take 2 capsules (200 mg total) by mouth every 8 (eight) hours. 02/22/23  Yes Margarette Canada, NP  doxycycline (VIBRAMYCIN) 100 MG capsule Take 1 capsule (100 mg total) by mouth 2 (two) times daily for 7 days. 02/22/23 03/01/23 Yes Margarette Canada, NP  ipratropium (ATROVENT) 0.06 % nasal spray Place 2 sprays into both nostrils 4 (four) times daily. 02/22/23  Yes Margarette Canada, NP  predniSONE (DELTASONE) 20 MG tablet Take 3 tablets (60 mg total) by mouth daily with breakfast for 5 days. 3 tablets daily for 5 days. 02/22/23 02/27/23 Yes Margarette Canada, NP  promethazine-dextromethorphan (PROMETHAZINE-DM) 6.25-15 MG/5ML syrup Take 5 mLs by mouth 4 (four) times daily as needed. 02/22/23  Yes Margarette Canada, NP  albuterol (PROVENTIL HFA) 108 (90 Base) MCG/ACT inhaler Inhale 2 puffs into the lungs every 4 (four) hours as needed. 05/16/17   [provider]   fluticasone (FLONASE) 50 MCG/ACT nasal spray Place 2 sprays into both nostrils daily. 11/27/22   Lattie Corns, PA-C    Family History Family History  Problem Relation Age of Onset   Heart attack Mother    CAD Mother    Healthy Father     Social History Social History   Tobacco Use   Smoking status: Never   Smokeless tobacco: Never  Vaping Use   Vaping Use: Every day  Substance Use Topics   Alcohol use: Yes   Drug use: Yes    Types: Marijuana     Allergies   Penicillins   Review of Systems Review of Systems  Constitutional:  Negative for fever.  HENT:  Positive for congestion, rhinorrhea and sore throat. Negative for ear pain.   Respiratory:  Positive for cough. Negative for shortness of breath and wheezing.   Gastrointestinal:  Negative for diarrhea, nausea and vomiting.  Skin:  Negative for rash.     Physical Exam Triage Vital Signs ED Triage Vitals [02/22/23 0814]  Enc Vitals Group     BP      Pulse      Resp      Temp      Temp src      SpO2      Weight      Height      Head Circumference      Peak Flow  Pain Score 0     Pain Loc      Pain Edu?      Excl. in [place]?    No data found.  Updated Vital Signs BP 129/89 (BP Location: Right Arm)   Pulse 66   Temp 98.2 F (36.8 C) (Oral)   Resp 16   SpO2 98%   Visual Acuity Right Eye Distance:   Left Eye Distance:   Bilateral Distance:    Right Eye Near:   Left Eye Near:    Bilateral Near:     Physical Exam Vitals and nursing note reviewed.  Constitutional:      Appearance: Normal appearance. He is not ill-appearing.  HENT:     Head: Normocephalic and atraumatic.     Right Ear: Tympanic membrane, ear canal and external ear normal. There is no impacted cerumen.     Left Ear: Tympanic membrane, ear canal and external ear normal. There is no impacted cerumen.     Nose: Congestion and rhinorrhea present.     Comments: Nasal mucosa is erythematous and mildly edematous with scant  clear discharge in both nares.    Mouth/Throat:     Mouth: Mucous membranes are moist.     Pharynx: Oropharynx is clear. Posterior oropharyngeal erythema present. No oropharyngeal exudate.     Comments: Tonsillar pillars are unremarkable.  There is erythema, injection, and clear postnasal drip in the posterior oropharynx. Cardiovascular:     Rate and Rhythm: Normal rate and regular rhythm.     Pulses: Normal pulses.     Heart sounds: Normal heart sounds. No murmur heard.    No friction rub. No gallop.  Pulmonary:     Effort: Pulmonary effort is normal.     Breath sounds: Wheezing and rhonchi present. No rales.     Comments: Patient has wheezes and rhonchi diffusely. Musculoskeletal:     Cervical back: Normal range of motion and neck supple.  Lymphadenopathy:     Cervical: No cervical adenopathy.  Skin:    General: Skin is warm and dry.     Capillary Refill: Capillary refill takes less than 2 seconds.     Findings: No rash.  Neurological:     General: No focal deficit present.     Mental Status: He is alert and oriented to person, place, and time.      UC Treatments / Results  Labs (all labs ordered are listed, but only abnormal results are displayed) Labs Reviewed - No data to display  EKG   Radiology No results found.  Procedures Procedures (including critical care time)  Medications Ordered in UC Medications - No data to display  Initial Impression / Assessment and Plan / UC Course  I have reviewed the triage vital signs and the nursing notes.  Pertinent labs & imaging results that were available during my care of the patient were reviewed by me and considered in my medical decision making (see chart for details).   Patient is a pleasant, nontoxic-appearing 22 year old male with a past history of asthma presenting for evaluation of 1 week history of respiratory complaints as outlined in HPI above.  Patient has not had a fever and he is afebrile in clinic at 98.2.   The rest of vital signs are unremarkable.  He has a 98% sat on room air.  He is not in any acute distress and he can speak in full sentence without dyspnea or tachypnea.  He does have diffuse wheezes and rhonchi in all lung  fields on auscultation which is presenting a bronchitis picture.  He is using his albuterol inhaler and nebulizer at home but he is also smoking.  Because he is a smoker and has asthma I will treat him with a 7-day course of doxycycline for his bronchitis along with Atrovent nasal spray to help with the nasal congestion, prednisone to help with pulmonary inflammation, Tessalon Perles and Promethazine DM cough syrup to help with cough and congestion.  He can continue his albuterol nebulizer and inhaler every 4 hours as needed.  Return precautions reviewed.  Work note provided.   Final Clinical Impressions(s) / UC Diagnoses   Final diagnoses:  Bronchitis  Upper respiratory tract infection, unspecified type     Discharge Instructions      Use the albuterol inhaler/nebulizer every 4-6 hours as needed for shortness of breath, wheezing, and cough.  Use the Atrovent nasal spray, 2 squirts in each nostril every 6 hours as needed for congestion.  Take the Doxycycline twice daily with food for 7 days for treatment of bronchitis.  Take the prednisone according to the package instructions to help with pulmonary inflammation.  Use the Tessalon Perles every 8 hours for your cough.  Taken with a small sip of water.  They may give you some numbness to the base of your tongue or metallic taste in her mouth, this is normal.  They are designed to calm down the cough reflex.  Use the Promethazine DM cough syrup at bedtime as will make you drowsy.  You may take 1 teaspoon (5 mL) every 6 hours.  Return for reevaluation for new or worsening symptoms.      ED Prescriptions     Medication Sig Dispense Auth. Provider   doxycycline (VIBRAMYCIN) 100 MG capsule Take 1 capsule (100 mg total)  by mouth 2 (two) times daily for 7 days. 14 capsule Margarette Canada, NP   benzonatate (TESSALON) 100 MG capsule Take 2 capsules (200 mg total) by mouth every 8 (eight) hours. 21 capsule Margarette Canada, NP   ipratropium (ATROVENT) 0.06 % nasal spray Place 2 sprays into both nostrils 4 (four) times daily. 15 mL Margarette Canada, NP   promethazine-dextromethorphan (PROMETHAZINE-DM) 6.25-15 MG/5ML syrup Take 5 mLs by mouth 4 (four) times daily as needed. 118 mL Margarette Canada, NP   predniSONE (DELTASONE) 20 MG tablet Take 3 tablets (60 mg total) by mouth daily with breakfast for 5 days. 3 tablets daily for 5 days. 15 tablet Margarette Canada, NP      PDMP not reviewed this encounter.   Margarette Canada, NP 02/22/23 [phone]

## 2023-02-22 NOTE — ED Triage Notes (Signed)
Pt presents with a cough and sore throat for over 1 week.

## 2023-04-28 IMAGING — CR DG NECK SOFT TISSUE
3 series · 3 of 3 positions shown · non-contrast
Comparison: None.

CLINICAL DATA: Swollen neck sore throat

EXAM:
NECK SOFT TISSUES - 1+ VIEW

[neck lat]
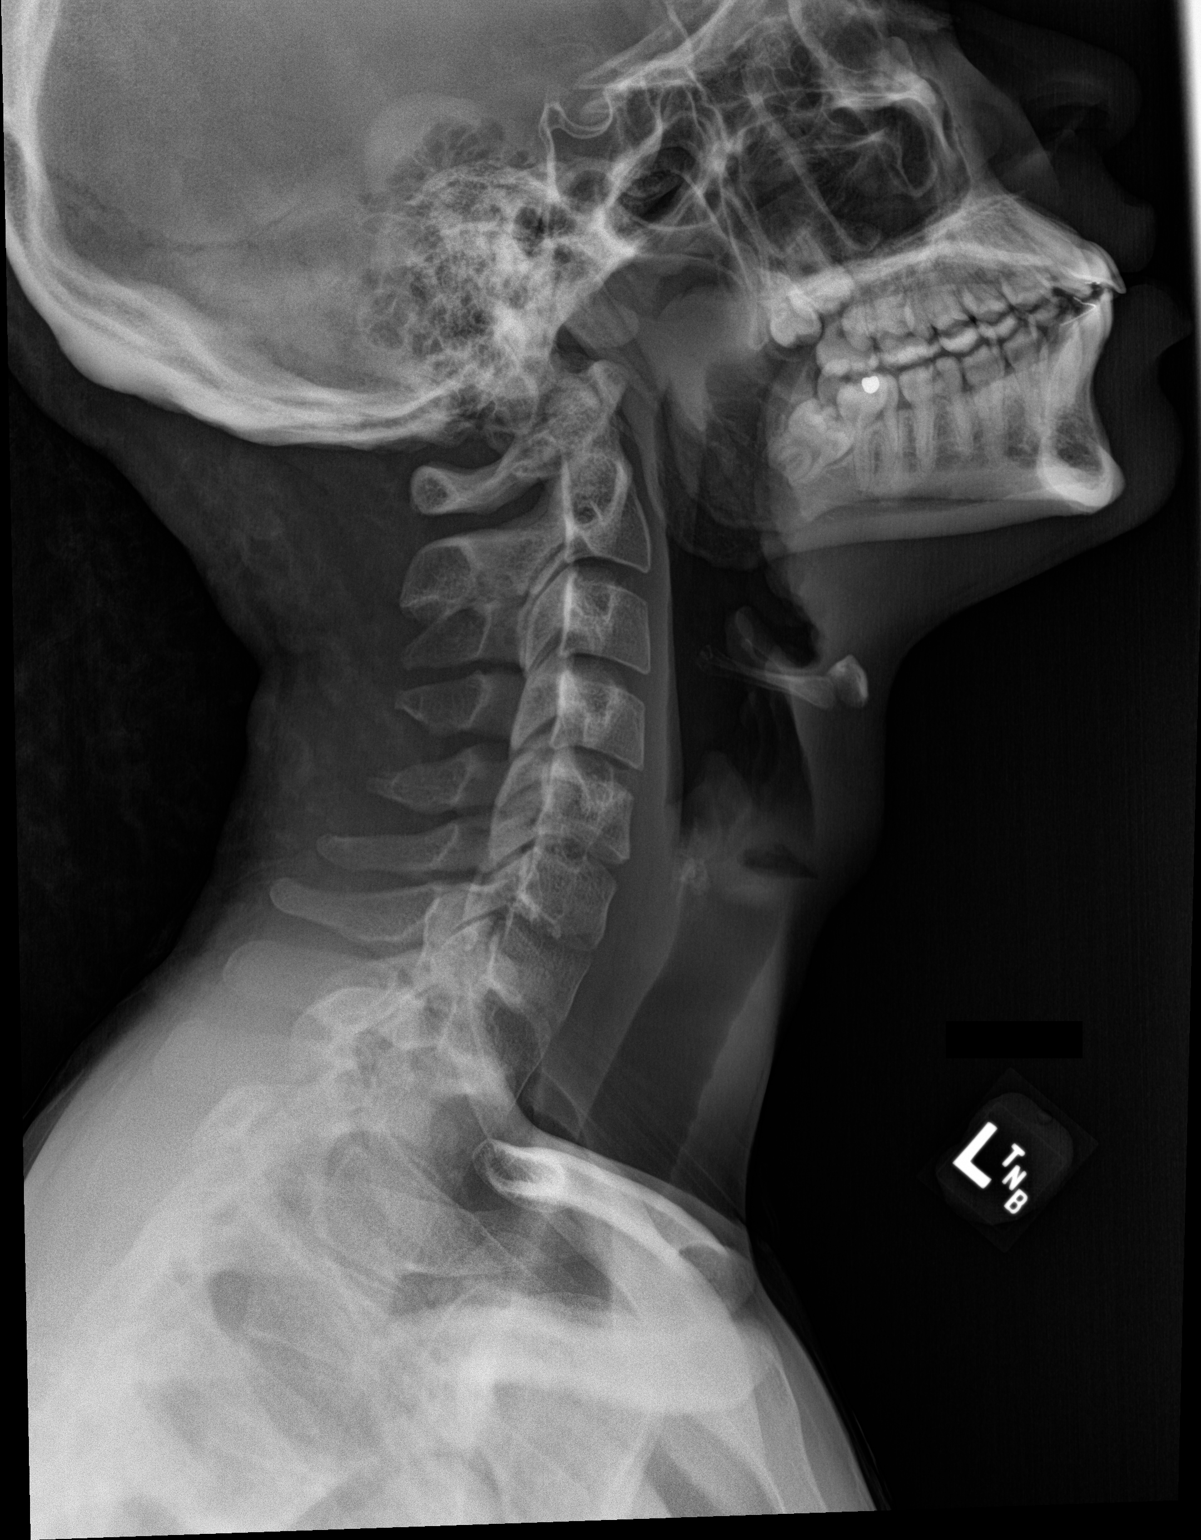

[neck ap (1 of 2)]
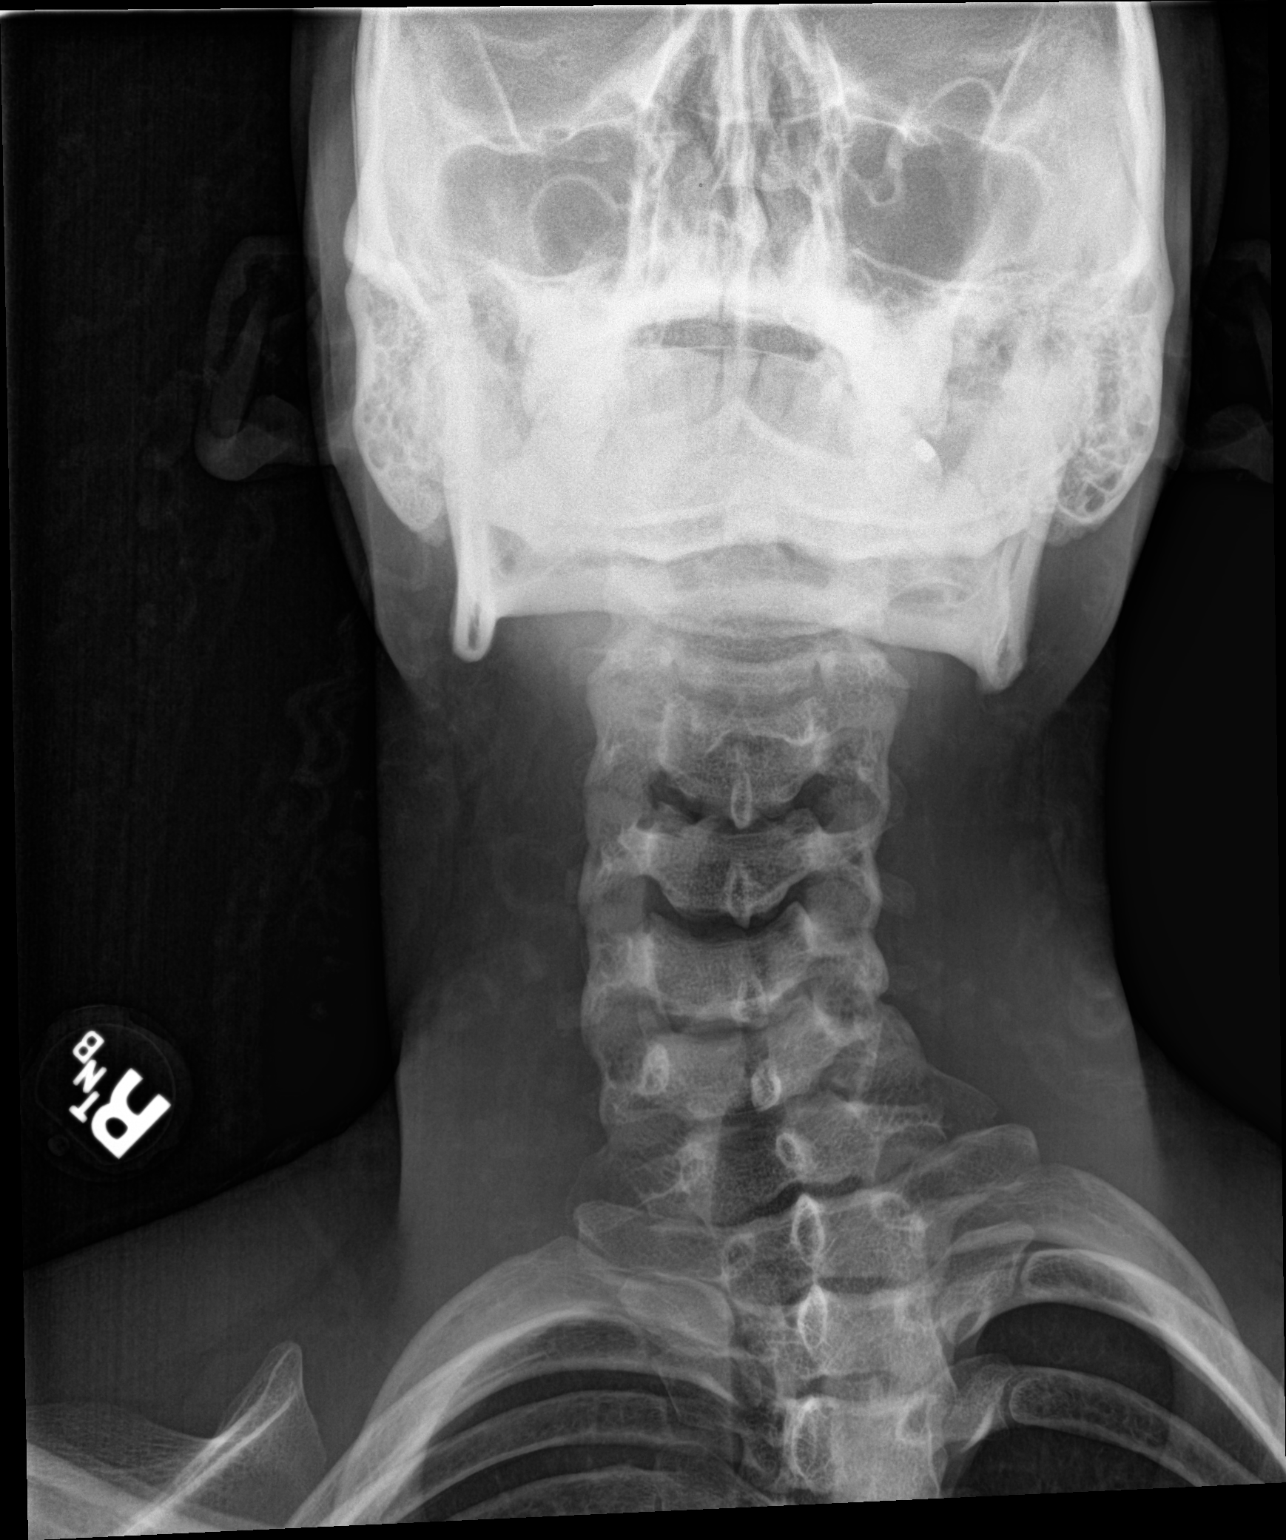

[neck ap (2 of 2)]
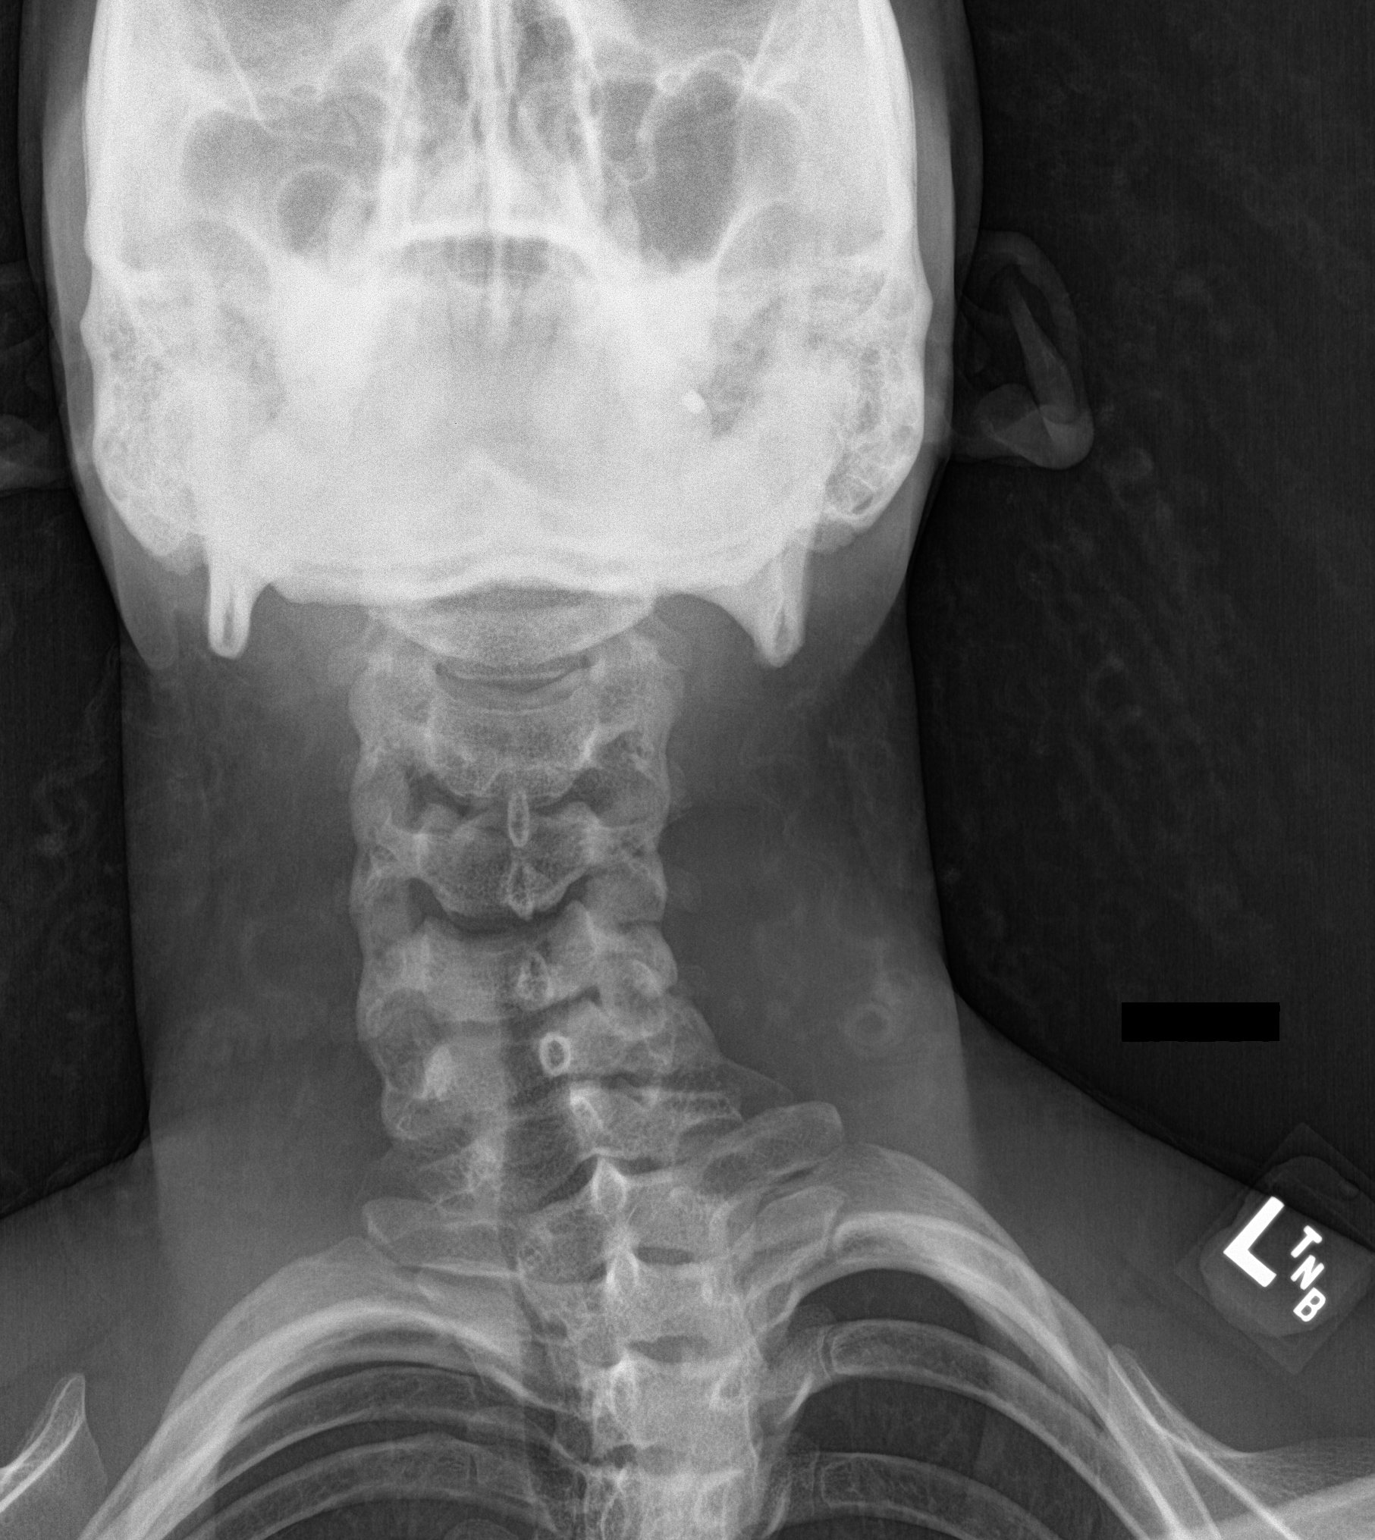

[3 of 3 positions shown; findings below may reference images not displayed]

FINDINGS: Prevertebral soft tissue thickness within normal limits. No
retropharyngeal gas collections. Epiglottis within normal limits.
Subglottic trachea is patent. No radiopaque foreign body
IMPRESSION: Negative.
